# Patient Record
Sex: Female | Born: 1980 | Hispanic: No | Marital: Married | Smoking: Never smoker
Health system: Southern US, Community
[De-identification: ages and names within clinical notes are randomized; demographics above are authoritative.]

## PROBLEM LIST (undated history)

## (undated) DIAGNOSIS — R87619 Unspecified abnormal cytological findings in specimens from cervix uteri: Secondary | ICD-10-CM

## (undated) DIAGNOSIS — IMO0002 Reserved for concepts with insufficient information to code with codable children: Secondary | ICD-10-CM

## (undated) HISTORY — DX: Unspecified abnormal cytological findings in specimens from cervix uteri: R87.619

## (undated) HISTORY — DX: Reserved for concepts with insufficient information to code with codable children: IMO0002

## (undated) HISTORY — PX: COLPOSCOPY: SHX161

---

## 2005-05-19 ENCOUNTER — Other Ambulatory Visit: Admission: RE | Admit: 2005-05-19 | Discharge: 2005-05-19 | Payer: Self-pay | Admitting: Obstetrics and Gynecology

## 2005-10-27 ENCOUNTER — Other Ambulatory Visit: Admission: RE | Admit: 2005-10-27 | Discharge: 2005-10-27 | Payer: Self-pay | Admitting: Obstetrics and Gynecology

## 2006-03-02 ENCOUNTER — Other Ambulatory Visit: Admission: RE | Admit: 2006-03-02 | Discharge: 2006-03-02 | Payer: Self-pay | Admitting: Obstetrics and Gynecology

## 2006-07-18 ENCOUNTER — Other Ambulatory Visit: Admission: RE | Admit: 2006-07-18 | Discharge: 2006-07-18 | Payer: Self-pay | Admitting: Obstetrics and Gynecology

## 2007-07-23 ENCOUNTER — Other Ambulatory Visit: Admission: RE | Admit: 2007-07-23 | Discharge: 2007-07-23 | Payer: Self-pay | Admitting: Obstetrics and Gynecology

## 2008-07-23 ENCOUNTER — Other Ambulatory Visit: Admission: RE | Admit: 2008-07-23 | Discharge: 2008-07-23 | Payer: Self-pay | Admitting: Obstetrics and Gynecology

## 2012-10-04 ENCOUNTER — Encounter (HOSPITAL_COMMUNITY): Payer: Self-pay | Admitting: Emergency Medicine

## 2012-10-04 ENCOUNTER — Emergency Department (HOSPITAL_COMMUNITY)
Admission: EM | Admit: 2012-10-04 | Discharge: 2012-10-05 | Disposition: A | Discharge status: Home / Self Care | Payer: Managed Care, Other (non HMO) | Attending: Emergency Medicine | Admitting: Emergency Medicine

## 2012-10-04 ENCOUNTER — Emergency Department (HOSPITAL_COMMUNITY): Payer: Managed Care, Other (non HMO)

## 2012-10-04 DIAGNOSIS — M542 Cervicalgia: Secondary | ICD-10-CM

## 2012-10-04 DIAGNOSIS — IMO0002 Reserved for concepts with insufficient information to code with codable children: Secondary | ICD-10-CM | POA: Insufficient documentation

## 2012-10-04 DIAGNOSIS — Y9389 Activity, other specified: Secondary | ICD-10-CM | POA: Insufficient documentation

## 2012-10-04 MED ORDER — OXYCODONE-ACETAMINOPHEN 5-325 MG PO TABS
2.0000 | ORAL_TABLET | Freq: Once | ORAL | Status: AC
  Administered 2012-10-04: 2 via ORAL
  Filled 2012-10-04: qty 2
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
## 2012-10-05 MED ORDER — OXYCODONE-ACETAMINOPHEN 5-325 MG PO TABS
ORAL_TABLET | ORAL | Status: DC
Start: 2012-10-05 — End: 2013-08-06

## 2012-10-05 NOTE — ED Provider Notes (Signed)
Medical screening examination/treatment/procedure(s) were performed by non-physician practitioner and as supervising physician I was immediately available for consultation/collaboration.  Dameer Speiser, MD 10/05/12 0253 
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
## 2012-10-05 NOTE — ED Notes (Signed)
Patient given discharge instructions, information, prescriptions, and diet order. Patient states that they adequately understand discharge information given and to return to ED if symptoms return or worsen.    Pt advised to come back for further problems and to use ice on back of neck.
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
## 2013-08-05 ENCOUNTER — Encounter: Payer: Self-pay | Admitting: Certified Nurse Midwife

## 2013-08-06 ENCOUNTER — Ambulatory Visit (INDEPENDENT_AMBULATORY_CARE_PROVIDER_SITE_OTHER): Payer: Managed Care, Other (non HMO) | Admitting: Certified Nurse Midwife

## 2013-08-06 ENCOUNTER — Encounter: Payer: Self-pay | Admitting: Certified Nurse Midwife

## 2013-08-06 VITALS — BP 100/60 | HR 52 | Ht 66.0 in | Wt 124.0 lb

## 2013-08-06 DIAGNOSIS — Z0000 Encounter for general adult medical examination without abnormal findings: Secondary | ICD-10-CM

## 2013-08-06 DIAGNOSIS — Z01419 Encounter for gynecological examination (general) (routine) without abnormal findings: Secondary | ICD-10-CM

## 2013-08-06 LAB — HEMOGLOBIN, FINGERSTICK: Hemoglobin, fingerstick: 13.8 g/dL (ref 12.0–16.0)
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
## 2013-08-06 NOTE — Patient Instructions (Signed)
General topics  Next pap or exam is  due in 1 year Take a Women's multivitamin Take 1200 mg. of calcium daily - prefer dietary If any concerns in interim to call back  Breast Self-Awareness Practicing breast self-awareness may pick up problems early, prevent significant medical complications, and possibly save your life. By practicing breast self-awareness, you can become familiar with how your breasts look and feel and if your breasts are changing. This allows you to notice changes early. It can also offer you some reassurance that your breast health is good. One way to learn what is normal for your breasts and whether your breasts are changing is to do a breast self-exam. If you find a lump or something that was not present in the past, it is best to contact your caregiver right away. Other findings that should be evaluated by your caregiver include nipple discharge, especially if it is bloody; skin changes or reddening; areas where the skin seems to be pulled in (retracted); or new lumps and bumps. Breast pain is seldom associated with cancer (malignancy), but should also be evaluated by a caregiver. BREAST SELF-EXAM The best time to examine your breasts is 5 7 days after your menstrual period is over.  ExitCare Patient Information 2013 [employer].   Exercise to Stay Healthy Exercise helps you become and stay healthy. EXERCISE IDEAS AND TIPS Choose exercises that:  You enjoy.  Fit into your day. You do not need to exercise really hard to be healthy. You can do exercises at a slow or medium level and stay healthy. You can:  Stretch before and after working out.  Try yoga, Pilates, or tai chi.  Lift weights.  Walk fast, swim, jog, run, climb stairs, bicycle, dance, or rollerskate.  Take aerobic classes. Exercises that burn about 150 calories:  Running 1  miles in 15 minutes.  Playing volleyball for 45 to 60 minutes.  Washing and waxing a car for 45 to 60  minutes.  Playing touch football for 45 minutes.  Walking 1  miles in 35 minutes.  Pushing a stroller 1  miles in 30 minutes.  Playing basketball for 30 minutes.  Raking leaves for 30 minutes.  Bicycling 5 miles in 30 minutes.  Walking 2 miles in 30 minutes.  Dancing for 30 minutes.  Shoveling snow for 15 minutes.  Swimming laps for 20 minutes.  Walking up stairs for 15 minutes.  Bicycling 4 miles in 15 minutes.  Gardening for 30 to 45 minutes.  Jumping rope for 15 minutes.  Washing windows or floors for 45 to 60 minutes. Document Released: 12/30/2010 Document Revised: 02/19/2012 Document Reviewed: 12/30/2010 ExitCare Patient Information 2013 [employer].   Other topics ( that may be useful information):    Sexually Transmitted Disease Sexually transmitted disease (STD) refers to any infection that is passed from person to person during sexual activity. This may happen by way of saliva, semen, blood, vaginal mucus, or urine. Common STDs include:  Gonorrhea.  Chlamydia.  Syphilis.  HIV/AIDS.  Genital herpes.  Hepatitis B and C.  Trichomonas.  Human papillomavirus (HPV).  Pubic lice. CAUSES  An STD may be spread by bacteria, virus, or parasite. A person can get an STD by:  Sexual intercourse with an infected person.  Sharing sex toys with an infected person.  Sharing needles with an infected person.  Having intimate contact with the genitals, mouth, or rectal areas of an infected person. SYMPTOMS  Some people may not have any symptoms, but   they can still pass the infection to others. Different STDs have different symptoms. Symptoms include:  Painful or bloody urination.  Pain in the pelvis, abdomen, vagina, anus, throat, or eyes.  Skin rash, itching, irritation, growths, or sores (lesions). These usually occur in the genital or anal area.  Abnormal vaginal discharge.  Penile discharge in men.  Soft, flesh-colored skin growths in the  genital or anal area.  Fever.  Pain or bleeding during sexual intercourse.  Swollen glands in the groin area.  Yellow skin and eyes (jaundice). This is seen with hepatitis. DIAGNOSIS  To make a diagnosis, your caregiver may:  Take a medical history.  Perform a physical exam.  Take a specimen (culture) to be examined.  Examine a sample of discharge under a microscope.  Perform blood test TREATMENT   Chlamydia, gonorrhea, trichomonas, and syphilis can be cured with antibiotic medicine.  Genital herpes, hepatitis, and HIV can be treated, but not cured, with prescribed medicines. The medicines will lessen the symptoms.  Genital warts from HPV can be treated with medicine or by freezing, burning (electrocautery), or surgery. Warts may come back.  HPV is a virus and cannot be cured with medicine or surgery.However, abnormal areas may be followed very closely by your caregiver and may be removed from the cervix, vagina, or vulva through office procedures or surgery. If your diagnosis is confirmed, your recent sexual partners need treatment. This is true even if they are symptom-free or have a negative culture or evaluation. They should not have sex until their caregiver says it is okay. HOME CARE INSTRUCTIONS  All sexual partners should be informed, tested, and treated for all STDs.  Take your antibiotics as directed. Finish them even if you start to feel better.  Only take over-the-counter or prescription medicines for pain, discomfort, or fever as directed by your caregiver.  Rest.  Eat a balanced diet and drink enough fluids to keep your urine clear or pale yellow.  Do not have sex until treatment is completed and you have followed up with your caregiver. STDs should be checked after treatment.  Keep all follow-up appointments, Pap tests, and blood tests as directed by your caregiver.  Only use latex condoms and water-soluble lubricants during sexual activity. Do not use  petroleum jelly or oils.  Avoid alcohol and illegal drugs.  Get vaccinated for HPV and hepatitis. If you have not received these vaccines in the past, talk to your caregiver about whether one or both might be right for you.  Avoid risky sex practices that can break the skin. The only way to avoid getting an STD is to avoid all sexual activity.Latex condoms and dental dams (for oral sex) will help lessen the risk of getting an STD, but will not completely eliminate the risk. SEEK MEDICAL CARE IF:   You have a fever.  You have any new or worsening symptoms. Document Released: 02/17/2003 Document Revised: 02/19/2012 Document Reviewed: 02/24/2011 ExitCare Patient Information 2013 [employer].    Domestic Abuse You are being battered or abused if someone close to you hits, pushes, or physically hurts you in any way. You also are being abused if you are forced into activities. You are being sexually abused if you are forced to have sexual contact of any kind. You are being emotionally abused if you are made to feel worthless or if you are constantly threatened. It is important to remember that help is available. No one has the right to abuse you. PREVENTION OF FURTHER   ABUSE  Learn the warning signs of danger. This varies with situations but may include: the use of alcohol, threats, isolation from friends and family, or forced sexual contact. Leave if you feel that violence is going to occur.  If you are attacked or beaten, report it to the police so the abuse is documented. You do not have to press charges. The police can protect you while you or the attackers are leaving. Get the officer's name and badge number and a copy of the report.  Find someone you can trust and tell them what is happening to you: your caregiver, a nurse, clergy member, close friend or family member. Feeling ashamed is natural, but remember that you have done nothing wrong. No one deserves abuse. Document Released:  11/24/2000 Document Revised: 02/19/2012 Document Reviewed: 02/02/2011 ExitCare Patient Information 2013 [employer].    How Much is Too Much Alcohol? Drinking too much alcohol can cause injury, accidents, and health problems. These types of problems can include:   Car crashes.  Falls.  Family fighting (domestic violence).  Drowning.  Fights.  Injuries.  Burns.  Damage to certain organs.  Having a baby with birth defects. ONE DRINK CAN BE TOO MUCH WHEN YOU ARE:  Working.  Pregnant or breastfeeding.  Taking medicines. Ask your doctor.  Driving or planning to drive. If you or someone you know has a drinking problem, get help from a doctor.  Document Released: 09/23/2009 Document Revised: 02/19/2012 Document Reviewed: 09/23/2009 ExitCare Patient Information 2013 [employer].   Smoking Hazards Smoking cigarettes is extremely bad for your health. Tobacco smoke has over 200 known poisons in it. There are over 60 chemicals in tobacco smoke that cause cancer. Some of the chemicals found in cigarette smoke include:   Cyanide.  Benzene.  Formaldehyde.  Methanol (wood alcohol).  Acetylene (fuel used in welding torches).  Ammonia. Cigarette smoke also contains the poisonous gases nitrogen oxide and carbon monoxide.  Cigarette smokers have an increased risk of many serious medical problems and Smoking causes approximately:  90% of all lung cancer deaths in men.  80% of all lung cancer deaths in women.  90% of deaths from chronic obstructive lung disease. Compared with nonsmokers, smoking increases the risk of:  Coronary heart disease by 2 to 4 times.  Stroke by 2 to 4 times.  Men developing lung cancer by 23 times.  Women developing lung cancer by 13 times.  Dying from chronic obstructive lung diseases by 12 times.  . Smoking is the most preventable cause of death and disease in our society.  WHY IS SMOKING ADDICTIVE?  Nicotine is the chemical  agent in tobacco that is capable of causing addiction or dependence.  When you smoke and inhale, nicotine is absorbed rapidly into the bloodstream through your lungs. Nicotine absorbed through the lungs is capable of creating a powerful addiction. Both inhaled and non-inhaled nicotine may be addictive.  Addiction studies of cigarettes and spit tobacco show that addiction to nicotine occurs mainly during the teen years, when young people begin using tobacco products. WHAT ARE THE BENEFITS OF QUITTING?  There are many health benefits to quitting smoking.   Likelihood of developing cancer and heart disease decreases. Health improvements are seen almost immediately.  Blood pressure, pulse rate, and breathing patterns start returning to normal soon after quitting. QUITTING SMOKING   American Lung Association - 1-800-LUNGUSA  American Cancer Society - 1-800-ACS-2345 Document Released: 01/04/2005 Document Revised: 02/19/2012 Document Reviewed: 09/08/2009 ExitCare Patient Information 2013 [employer].   Stress Management Stress is a state of physical or mental tension that often results from changes in your life or normal routine. Some common causes of stress are:  Death of a loved one.  Injuries or severe illnesses.  Getting fired or changing jobs.  Moving into a new home. Other causes may be:  Sexual problems.  Business or financial losses.  Taking on a large debt.  Regular conflict with someone at home or at work.  Constant tiredness from lack of sleep. It is not just bad things that are stressful. It may be stressful to:  Win the lottery.  Get married.  Buy a new car. The amount of stress that can be easily tolerated varies from person to person. Changes generally cause stress, regardless of the types of change. Too much stress can affect your health. It may lead to physical or emotional problems. Too little stress (boredom) may also become stressful. SUGGESTIONS TO  REDUCE STRESS:  Talk things over with your family and friends. It often is helpful to share your concerns and worries. If you feel your problem is serious, you may want to get help from a professional counselor.  Consider your problems one at a time instead of lumping them all together. Trying to take care of everything at once may seem impossible. List all the things you need to do and then start with the most important one. Set a goal to accomplish 2 or 3 things each day. If you expect to do too many in a single day you will naturally fail, causing you to feel even more stressed.  Do not use alcohol or drugs to relieve stress. Although you may feel better for a short time, they do not remove the problems that caused the stress. They can also be habit forming.  Exercise regularly - at least 3 times per week. Physical exercise can help to relieve that "uptight" feeling and will relax you.  The shortest distance between despair and hope is often a good night's sleep.  Go to bed and get up on time allowing yourself time for appointments without being rushed.  Take a short "time-out" period from any stressful situation that occurs during the day. Close your eyes and take some deep breaths. Starting with the muscles in your face, tense them, hold it for a few seconds, then relax. Repeat this with the muscles in your neck, shoulders, hand, stomach, back and legs.  Take good care of yourself. Eat a balanced diet and get plenty of rest.  Schedule time for having fun. Take a break from your daily routine to relax. HOME CARE INSTRUCTIONS   Call if you feel overwhelmed by your problems and feel you can no longer manage them on your own.  Return immediately if you feel like hurting yourself or someone else. Document Released: 05/23/2001 Document Revised: 02/19/2012 Document Reviewed: 01/13/2008 ExitCare Patient Information 2013 [employer].   

## 2013-08-06 NOTE — Progress Notes (Signed)
Note reviewed, agree with plan.  Clairissa Valvano, MD  

## 2013-08-07 LAB — VITAMIN D 25 HYDROXY (VIT D DEFICIENCY, FRACTURES): Vit D, 25-Hydroxy: 58 ng/mL (ref 30–89)

## 2013-08-07 LAB — TSH: TSH: 1.507 u[IU]/mL (ref 0.350–4.500)

## 2013-10-21 ENCOUNTER — Encounter: Payer: Self-pay | Admitting: Certified Nurse Midwife

## 2014-08-07 ENCOUNTER — Ambulatory Visit: Payer: Managed Care, Other (non HMO) | Admitting: Certified Nurse Midwife

## 2014-08-10 ENCOUNTER — Telehealth: Payer: Self-pay | Admitting: Certified Nurse Midwife

## 2014-08-10 NOTE — Telephone Encounter (Signed)
Pt r/s appt for aex less than 24 hrs for tomorrow because she is on vacation. Made her aware that she will be charged a $50 fee and she would like fee waived because she feels that since we rescheduled original appt and sent her a letter that she should not have to pay even though she stated to me that she was aware of the change and thought she was just suppose to call and reschedule appointment.

## 2014-08-11 ENCOUNTER — Ambulatory Visit: Payer: Managed Care, Other (non HMO) | Admitting: Certified Nurse Midwife

## 2014-08-20 ENCOUNTER — Encounter: Payer: Self-pay | Admitting: Certified Nurse Midwife

## 2014-08-20 NOTE — Telephone Encounter (Signed)
Please advise patient cannot waive DNKA.

## 2014-08-20 NOTE — Telephone Encounter (Signed)
Left message on vm for pt call. We cannot waive dnka fee.

## 2014-09-09 ENCOUNTER — Ambulatory Visit: Payer: Managed Care, Other (non HMO) | Admitting: Certified Nurse Midwife
